# Patient Record
Sex: Female | Born: 2000 | Race: Black or African American | Hispanic: No | Marital: Single | Smoking: Never smoker
Health system: Southern US, Community
[De-identification: ages and names within clinical notes are randomized; demographics above are authoritative.]

## PROBLEM LIST (undated history)

## (undated) DIAGNOSIS — F909 Attention-deficit hyperactivity disorder, unspecified type: Secondary | ICD-10-CM

## (undated) DIAGNOSIS — J45909 Unspecified asthma, uncomplicated: Secondary | ICD-10-CM

## (undated) DIAGNOSIS — F319 Bipolar disorder, unspecified: Secondary | ICD-10-CM

## (undated) DIAGNOSIS — A599 Trichomoniasis, unspecified: Secondary | ICD-10-CM

---
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
## 2023-09-03 NOTE — H&P (Signed)
 [facility] Labor and Delivery  Obstetrics History and Physical  Chief Complaint: Headache (HA that started shortly after delivery, left AMA yesterday without CT suggested by neuro.  HA has returned now and is a 10/10)  Prenatal Provider: [facility] ([facility])     History of Present Illness   Kaitlyn Hammond is a 23 y.o. [id number] at [redacted]w[redacted]d with an EDD of 09/19/2023, by Ultrasound.   Her pregnancy is complicated by:    Trichomoniasis   Obesity in pregnancy, antepartum (HHS-HCC)   Chronic hypertension affecting pregnancy (HHS-HCC)   Other specified attention deficit hyperactivity disorder (ADHD)   Depression complicating pregnancy, antepartum (HHS-HCC)  The patient is presenting today with a 10/10 headache and feeling like her body is shutting down. She left the hospital yesterday AMA without proceeding with further work-up for her 10/10 headache as suggested by neuro and anes teams.   Today in triage, the patient is on all 4s in the bed with her head resting lower on the pillow, saying that is the only position that makes the pain not excruciating. She states the pain now originates at her epidural site and radiates all the way up her spine, most notably at her c-spine and neck area, and shoots up to bilateral head. She reports the pain is now a 10/10, worse than the 7/10 HA she left the hospital with.   She denies vision changes (has baseline floaters which have remained stable; no scotoma), vision loss, shortness of breath, chest pain, RUQ pain, and/or leg pain/swelling. She denies fevers, chills, nausea, vomiting.   She is amenable to any plan and just wants the pain to go away and to be able to go home.   Upon re-evaluation, patient states she has had fevers/chills overnight and the headache worsens when she lays down with floaters that worsen when she stands up due to the headache. She says the pain also sometimes starts at the neck and  migrates to the epidural site and head.   ROS   Medical Screening Exam:      Review of systems as per HPI and below. All other systems reviewed and negative.  Pain: 10/10 headache; originates at epidural site and radiates up the spine to the neck and head Tolerating POs: yes Shortness of breath: No Vision changes: No Epigastric pain: No Headache: Yes  NIDA/TAPS screen: Negative.  Tobacco: Never  Alcohol: Never  Recreational drugs: Never     Prescription drugs: Never No indication to order urine drug screen.  Perinatal Information   ABO/RH: A Positive /--/--/-- (07/06 2242)  Antibody: Negative  (07/06 2242)  Rubella: Positive  (02/04 1552)  Varicella: immune  (12/04 0000) Syphilis: Negative /-- (07/06 1857)  HepB: NonReactive  (02/04 1552)  HepC: NonReactive /--/-- (02/04 1552) HIV: NonReactive  (05/07 1549)  Gonorrhea: Not Detected /--/--/-- (02/06 1158)  Chlamydia: Not Detected /--/--/-- (02/06 1158)  GBS: Positive for Beta hemolytic Streptococcus group B /--/-- (07/06 1608)   OB History  Gravida Para Term Preterm AB Living  3 1 1  0 2 1  SAB IAB Ectopic Molar Multiple Live Births  2    0 1    # Outcome Date GA Lbr Len/2nd Weight Sex Type Anes PTL Lv  3 Term 08/31/23 [redacted]w[redacted]d 10:26 / 02:10 3.26 kg (7 lb 3 oz) M Vag-Spont CSE  LIV  2 SAB           1 SAB  Past Medical History:  Diagnosis Date  . ADHD (attention deficit hyperactivity disorder)   . Attention deficit hyperactivity disorder (ADHD)   . Depression   . Hypertension   . Trichomoniasis      Past Surgical History:  Procedure Laterality Date  . LIPOSUCTION MULTIPLE BODY PARTS  2023  . PR DILATION OF CERVICAL CANAL  08/29/2023  . PR DILATION OF CERVICAL CANAL  08/30/2023     Family History  Problem Relation Age of Onset  . Heart disease Mother   . Diabetes Father   . Thyroid disease Maternal Grandmother   . Heart disease Maternal Grandmother   . Heart disease Maternal Grandfather       Social Drivers of Health with Concerns   Tobacco Use: Medium Risk (05/14/2023)   Patient History   . Smoking Tobacco Use: Former   . Smokeless Tobacco Use: Never  Housing Stability: Unknown (08/30/2023)   Housing Stability Vital Sign   . Unable to Pay for Housing in the Last Year: No   . Number of Times Moved in the Last Year: 0   . Homeless in the Last Year: Patient unable to answer   Abuse/neglect Assessment: Is Anyone Hurting/Threatening You or Making You Feel Afraid? : No  Medications   Medications Prior to Admission  Medication Sig Dispense Refill  . acetaminophen  (TYLENOL ) 325 MG tablet Take 2 tablets (650 mg total) by mouth every 4 (four) hours as needed for up to 10 days 30 tablet 0  . cyclobenzaprine  (FLEXERIL ) 10 MG tablet Take 1 tablet (10 mg total) by mouth every 8 (eight) hours as needed for Muscle spasms for up to 10 days 10 tablet 0  . ibuprofen  (MOTRIN ) 600 MG tablet Take 1 tablet (600 mg total) by mouth every 6 (six) hours for 10 days 30 tablet 0  . dextroamphetamine-amphetamine (ADDERALL XR) 15 MG XR capsule Take 1 capsule (15 mg total) by mouth every morning . 30 capsule 0  . docusate (COLACE) 100 MG capsule Take 1 capsule (100 mg total) by mouth 2 (two) times daily for 10 days 20 capsule 0  . escitalopram oxalate (LEXAPRO) 20 MG tablet Take 1 tablet (20 mg total) by mouth once daily 30 tablet 0  . ferrous sulfate  325 (65 FE) MG tablet Take 1 tablet (325 mg total) by mouth daily with breakfast 30 tablet 1  . FLUoxetine (PROZAC) 10 MG capsule Take 1 capsule (10 mg total) by mouth once daily 30 capsule 0  . NIFEdipine (ADALAT CC) 30 MG ER tablet Take 1 tablet (30 mg total) by mouth once daily 90 tablet 0  . prenatal vitamin-iron-FA (PRENATE PLUS) tablet Take 1 tablet by mouth once daily    . simethicone (MYLICON) 80 MG chewable tablet Take 1 tablet (80 mg total) by mouth 4 (four) times daily as needed for Flatulence (gaseous distention) for up to 10 days 30 tablet 0  .  triamcinolone 0.1 % lotion Apply topically 2 (two) times daily 60 mL 0  . witch hazeL (TUCKS) 50 % pad Apply 1 Application topically 3 (three) times daily as needed (PRN perineal pain (2nd line)) 10 each 0   Allergies   Allergies  Allergen Reactions  . Penicillins Anaphylaxis  . Azithromycin Other (See Comments)    Face numb, white patches on throat, eye swelling.  Face numb, white patches on throat, eye swelling.  Face numb, white patches on throat, eye swelling.  Face numb, white patches on throat, eye swelling., Face numb, white patches  on throat, eye swelling.  . Latex Itching  . Macrodantin [Nitrofurantoin Macrocrystal] Rash  . Other Swelling    Allergic to mushrooms   Objective   Vitals:   09/03/23 1036 09/03/23 1037 09/03/23 1045  BP: 130/81 130/81 (!) 156/87  Pulse: 103 86 92  Resp: 18    Temp: 36.8 C (98.2 F)    TempSrc: Oral    SpO2: 99% 98% 99%  Weight: (!) 106.2 kg (234 lb 2.1 oz)    Height: 152.4 cm (5')    PainSc:  10-Worst pain ever      General: alert and cooperative; patient visibly in discomfort Cardiovascular: regular rate and rhythm Respiratory: Normal work of breathing, normal respiration rate Neuro: alert and oriented x 4, answers questions appropriately, CN II-XII grossly intact, pupils equal, round, reactive to light, and moves all extremities Psych: normal mood, behavior, speech, dress and thought processes, pt is a good historian, anxious, and distressed Extremities: grossly normal with no edema or cyanosis   Assessment and Plan   Jamelia Varano is a 23 y.o. [id number] PPD #3 who presents with a worsening 10/10 HA.  Plan by Problem:  #Headache - PO headache cocktail ordered (unable to get IV) - Pre-E rule out: CBC, CMP ordered - lab results wnl; does not meet criteria for Pre-E - Consulted OB anes to re-evaluate nature/etiology of HA (consider blood patch if not neuro-related?) - Order peripheral IV team for access - Consulted  neurology for recs: CT head venogram protocol - awaiting imaging (radiology dept [phone])  - BP normotensive; CTM vitals and clinical status  For pain control per Neuro recs: Start with all of the following: - NS bolus 588ml-1,000ml, avoid if patient has heart failure - IV Compazine 10 mg (max 40mg /day) - IV magnesium  1000mg  - IV Benadryl 25mg  - 100 mg (max 400mg /day) - IV Toradol 30 mg (max 120mg /day), if patient has mild to moderate renal impairment 15 mg (max 60 mg/day)   If above medications ineffective, consider: - Seroquel (quetiapine) 100 mg once  - Decadron (dexamethasone) 10 mg IV once or Solumedrol (methylprednisolone) 500 mg IV once; if patient has diabetes would avoid or monitor Blood glucose - Depakene (valproic acid) 1,000mg  - 1,500 mg IV once, if pregnancy test is negative  - Triple shot: 2 mg Haldol IM/IV, 2 mg ativan IV, 2 mg cogentin po; if giving this please monitor patients respiratory and mental status afterwards, also patient will not be able to operate any machinery after this (ie. Drive home) and will need assistance   At the time of this note submission, patient resting in triage (HA improved to a 7/10) awaiting CT imaging for further work-up of HA etiology. Further documentation will be provided by oncoming care team.  Disposition: Continue triage evaluation  ----------------------------------------------- Level of Interpreter Services: No interpreter needed (no language barrier)  Discussed with Dr. Suzon.  Jon RONAL Pon, MD Obstetrics & Gynecology, PGY-1 [facility]  09/03/2023  Next OB Appointment: Future Appointments  Date Time Provider Department Center  10/20/2023  2:20 PM Delores Leita Ball, NP [facility]   ------------------------------------------------------------------------------- Attestation signed by Darron Lauraine Craven, MD at 09/04/2023  9:17 AM Attestation Statement:   This service was  rendered under my overall direction and control, and I was immediately available via phone/pager or present on site.  LAURAINE JAYSON DARRON, MD  -------------------------------------------------------------------------------
